# Patient Record
Sex: Male | Born: 1995 | Race: White | Hispanic: No | Marital: Single | Smoking: Former smoker
Health system: Southern US, Community
[De-identification: ages and names within clinical notes are randomized; demographics above are authoritative.]

## PROBLEM LIST (undated history)

## (undated) DIAGNOSIS — R011 Cardiac murmur, unspecified: Secondary | ICD-10-CM

## (undated) HISTORY — DX: Cardiac murmur, unspecified: R01.1

---
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
## 2014-01-05 HISTORY — PX: FINGER SURGERY: SHX640

## 2019-05-17 ENCOUNTER — Encounter: Payer: Self-pay | Admitting: *Deleted

## 2019-05-18 ENCOUNTER — Ambulatory Visit: Payer: Self-pay | Admitting: Cardiology

## 2019-05-18 ENCOUNTER — Encounter: Payer: Self-pay | Admitting: Cardiology

## 2019-05-31 ENCOUNTER — Other Ambulatory Visit: Payer: Self-pay

## 2019-05-31 ENCOUNTER — Emergency Department (HOSPITAL_COMMUNITY): Payer: Self-pay

## 2019-05-31 ENCOUNTER — Emergency Department (HOSPITAL_COMMUNITY)
Admission: EM | Admit: 2019-05-31 | Discharge: 2019-06-01 | Disposition: A | Discharge status: Home / Self Care | Payer: Self-pay | Attending: Emergency Medicine | Admitting: Emergency Medicine

## 2019-05-31 ENCOUNTER — Encounter (HOSPITAL_COMMUNITY): Payer: Self-pay | Admitting: Emergency Medicine

## 2019-05-31 DIAGNOSIS — R0602 Shortness of breath: Secondary | ICD-10-CM | POA: Insufficient documentation

## 2019-05-31 DIAGNOSIS — F17210 Nicotine dependence, cigarettes, uncomplicated: Secondary | ICD-10-CM | POA: Insufficient documentation

## 2019-05-31 DIAGNOSIS — Z20822 Contact with and (suspected) exposure to covid-19: Secondary | ICD-10-CM | POA: Insufficient documentation

## 2019-05-31 DIAGNOSIS — Z79899 Other long term (current) drug therapy: Secondary | ICD-10-CM | POA: Insufficient documentation

## 2019-05-31 DIAGNOSIS — R42 Dizziness and giddiness: Secondary | ICD-10-CM | POA: Insufficient documentation

## 2019-05-31 LAB — POC SARS CORONAVIRUS 2 AG -  ED: SARS Coronavirus 2 Ag: NEGATIVE

## 2019-05-31 NOTE — ED Triage Notes (Signed)
Pt c/o SOB x 3-4 months and constipation x 2 weeks.

## 2019-05-31 NOTE — ED Provider Notes (Signed)
[facility] EMERGENCY DEPARTMENT Provider Note   CSN: [id number] Arrival date & time: 05/31/19  2046     History Chief Complaint  Patient presents with  . Shortness of Breath    Corey Ross is a 24 y.o. male with no pertinent past medical history that presents to the emergency department for multiple complaints.  States that he mainly came to the emergency room because he was having shortness of breath for the past 3 to 4 months, states that it is intermittent and worse at night.  Denies any current chest pain, abdominal pain, nausea, vomiting.  Patient states that he has multiple appointments about 5 coming up for different complaints.  States that he is seeing a cardiologist, urologist, possible neurologist and gastroenterologist in the next couple of weeks.  Patient states that he also has complaints of dizziness and blurry vision for the past month.  States that he also feels more sleepy, and has been getting 8 hours of sleep.  Denies any headache or fevers or chills.  Patient has no past medical history.  Patient has never seen primary care doctor.  Patient states that he smokes daily, used to be full pack a day, however for the past 3 months he has been smoking for cigarettes daily.  States that he also uses occasional opioids that he gets from friends, states that the last time he did that was 2 weeks ago.  Denies any IV drug use or marijuana use.  Denies any alcohol.  Also mentions some constipation,  Last bowel movement yesterday.  HPI     History reviewed. No pertinent past medical history.  There are no problems to display for this patient.   History reviewed. No pertinent surgical history.     Family History  Problem Relation Age of Onset  . Cancer Brother     Social History   Tobacco Use  . Smoking status: Current Every Day Smoker    Types: Cigarettes  . Smokeless tobacco: Never Used  Substance Use Topics  . Alcohol use: Never  . Drug use: Yes    Types:  Marijuana    Comment: percs    Home Medications Prior to Admission medications   Not on File    Allergies    Amoxicillin, Morphine and related, and Penicillins  Review of Systems   Review of Systems  Constitutional: Negative for chills, diaphoresis, fatigue and fever.  HENT: Negative for congestion, sore throat and trouble swallowing.   Eyes: Negative for pain and visual disturbance.  Respiratory: Positive for cough and shortness of breath. Negative for wheezing.   Cardiovascular: Negative for chest pain, palpitations and leg swelling.  Gastrointestinal: Negative for abdominal distention, abdominal pain, diarrhea, nausea and vomiting.  Genitourinary: Negative for difficulty urinating and frequency.  Musculoskeletal: Negative for back pain, neck pain and neck stiffness.  Skin: Negative for pallor.  Neurological: Positive for dizziness. Negative for tremors, seizures, syncope, speech difficulty, weakness, light-headedness, numbness and headaches.  Psychiatric/Behavioral: Positive for sleep disturbance. Negative for confusion and hallucinations. The patient is not nervous/anxious.     Physical Exam Updated Vital Signs BP (!) 154/94 (BP Location: Left Arm)   Pulse (!) 105   Temp 98.1 F (36.7 C) (Oral)   Resp 20   Ht 5' 9.5" (1.765 m)   Wt 90.3 kg   SpO2 98%   BMI 28.97 kg/m   Physical Exam Constitutional:      General: He is not in acute distress.    Appearance: Normal  appearance. He is not ill-appearing, toxic-appearing or diaphoretic.     Comments: Patient keeps falling asleep or speaking to him, is easy to arouse but will fall asleep in the next couple of seconds.  Patient is not ill-appearing  HENT:     Mouth/Throat:     Mouth: Mucous membranes are moist.     Pharynx: Oropharynx is clear.  Eyes:     General: No visual field deficit or scleral icterus.    Extraocular Movements: Extraocular movements intact.     Right eye: Normal extraocular motion.     Left eye:  Normal extraocular motion.     Pupils: Pupils are equal, round, and reactive to light.  Cardiovascular:     Rate and Rhythm: Normal rate and regular rhythm.     Pulses: Normal pulses.     Heart sounds: Normal heart sounds.  Pulmonary:     Effort: Pulmonary effort is normal. No respiratory distress.     Breath sounds: Normal breath sounds. No stridor. No wheezing, rhonchi or rales.  Chest:     Chest wall: No tenderness.  Abdominal:     General: Abdomen is flat. There is no distension.     Palpations: Abdomen is soft.     Tenderness: There is no abdominal tenderness. There is no guarding or rebound.  Musculoskeletal:        General: No swelling or tenderness. Normal range of motion.     Cervical back: Normal range of motion and neck supple. No rigidity.     Right lower leg: No edema.     Left lower leg: No edema.  Skin:    General: Skin is warm and dry.     Capillary Refill: Capillary refill takes less than 2 seconds.     Coloration: Skin is not pale.  Neurological:     General: No focal deficit present.     Mental Status: He is alert and oriented to person, place, and time.     Cranial Nerves: Cranial nerves are intact. No cranial nerve deficit, dysarthria or facial asymmetry.     Sensory: Sensation is intact. No sensory deficit.     Motor: Motor function is intact. No weakness, tremor, atrophy, abnormal muscle tone, seizure activity or pronator drift.     Coordination: Coordination is intact. Finger-Nose-Finger Test normal. Rapid alternating movements normal.     Gait: Gait is intact. Gait normal.     Comments: Patient does keep drifting to sleep while speaking to him, is easy to arouse.   Psychiatric:        Mood and Affect: Mood normal.        Behavior: Behavior normal.     ED Results / Procedures / Treatments   Labs (all labs ordered are listed, but only abnormal results are displayed) Labs Reviewed  COMPREHENSIVE METABOLIC PANEL - Abnormal; Notable for the following  components:      Result Value   Glucose, Bld 101 (*)    Total Protein 6.3 (*)    All other components within normal limits  SARS CORONAVIRUS 2 (TAT 6-24 HRS)  CBC WITH DIFFERENTIAL/PLATELET  URINALYSIS, ROUTINE W REFLEX MICROSCOPIC  RAPID URINE DRUG SCREEN, HOSP PERFORMED  D-DIMER, QUANTITATIVE (NOT AT [facility])  POC SARS CORONAVIRUS 2 AG -  ED    EKG EKG Interpretation  Date/Time:  Wednesday May 31 2019 23:31:20 EDT Ventricular Rate:  74 PR Interval:    QRS Duration: 93 QT Interval:  376 QTC Calculation: 418 R Axis:  59 Text Interpretation: Sinus rhythm Normal ECG Confirmed by Gilda Crease [phone]) on 05/31/2019 11:40:38 PM   Radiology CT Head Wo Contrast  Result Date: 05/31/2019 CLINICAL DATA:  Dizziness EXAM: CT HEAD WITHOUT CONTRAST TECHNIQUE: Contiguous axial images were obtained from the base of the skull through the vertex without intravenous contrast. COMPARISON:  CT report 10/19/2014 FINDINGS: Brain: No evidence of acute infarction, hemorrhage, hydrocephalus, extra-axial collection or mass lesion/mass effect. Vascular: No hyperdense vessel or unexpected calcification. Skull: Normal. Negative for fracture or focal lesion. Sinuses/Orbits: No acute finding. Other: None IMPRESSION: Negative non contrasted CT appearance of the brain Electronically Signed   By: Jasmine Pang M.D.   On: 05/31/2019 23:54   DG Chest Port 1 View  Result Date: 05/31/2019 CLINICAL DATA:  Shortness of breath for 3-4 months and constipation for 2 weeks, negative COVID-19 EXAM: PORTABLE CHEST 1 VIEW COMPARISON:  None FINDINGS: No consolidation, features of edema, pneumothorax, or effusion. Pulmonary vascularity is normally distributed. The cardiomediastinal contours are unremarkable. No acute osseous or soft tissue abnormality. IMPRESSION: No acute cardiopulmonary abnormality. Electronically Signed   By: Kreg Shropshire M.D.   On: 05/31/2019 22:44    Procedures Procedures (including critical care  time)  Medications Ordered in ED Medications - No data to display  ED Course  I have reviewed the triage vital signs and the nursing notes.  Pertinent labs & imaging results that were available during my care of the patient were reviewed by me and considered in my medical decision making (see chart for details).    MDM Rules/Calculators/A&P                     Corey Ross is a 24 y.o. male with no pertinent past medical history that presents to the emergency department for multiple complaints.  Patient's main complaint is shortness of breath, states that it has been occurring for 4 months.  Shortness of breath is pleuritic, denies any chest pain.  Has no risk factors for PE, will get a D-dimer since shortness of breath is pleuritic and patient is tachycardia to 105.  On exam patient is dozing off multiple times throughout physical exam, I will get a urine drug screen and CT head at this time.  Patient does have a normal neuro exam with normal gait. No blurry vision on exam.   .Differential diagnoses considered include .The causes for shortness of breath include but are not limited to Cardiac (AHF, pericardial effusion and tamponade, arrhythmias, ischemia, etc) Respiratory (COPD, asthma, pneumonia, pneumothorax, primary pulmonary hypertension, PE/VQ mismatch) Hematological (anemia) Neuromuscular (ALS, Guillain-Barr, etc)  Initial interventions none, patient's exam is benign.  Patient is not complaining of any pain.  Denies any drug use for the past 2 weeks.  States that he occasionally takes his friend's oxycodone, at least once a month.  ECG interpreted by me demonstrated sinus rhythm. CXR interpreted by me demonstrated no acute abnormalities.  Labs demonstrated normal CBC and chemistries.  Covid swab negative.  D-dimer negative.  Urine drug screen positive for opioids, amphetamines.  After confronting the patient about this, patient states that he forgot that he took some opioids  yesterday.  Discussed that patient symptoms are most likely due to drug usage, discussed that he needs to stop doing drugs.  Given the above findings, my suspicion is that patient needs primary care to handle his multiple complaints.  Nothing acutely going on in the emergency department.  Patient does have follow-up with multiple specialists the  next couple weeks.  Discussed shortness of breath could actually be due to anxiety. Doubt asthma, lung exam benign. Pt does seem anxious about all his symptoms. Normal lung exam.   Doubt need for further emergent work up at this time. I explained the diagnosis and have given explicit precautions to return to the ER including for any other new or worsening symptoms. The patient understands and accepts the medical plan as it's been dictated and I have answered their questions. Discharge instructions concerning home care and prescriptions have been given. The patient is STABLE and is discharged to home in good condition  The plan for this patient was discussed with Dr. Estell Harpin, who voiced agreement and who oversaw evaluation and treatment of this patient. Final Clinical Impression(s) / ED Diagnoses Final diagnoses:  SOB (shortness of breath)    Rx / DC Orders ED Discharge Orders    None       Farrel Gordon, PA-C 06/02/19 1038    Bethann Berkshire, MD 06/02/19 1243

## 2019-06-01 LAB — URINALYSIS, ROUTINE W REFLEX MICROSCOPIC
Bilirubin Urine: NEGATIVE
Glucose, UA: NEGATIVE mg/dL
Hgb urine dipstick: NEGATIVE
Ketones, ur: NEGATIVE mg/dL
Leukocytes,Ua: NEGATIVE
Nitrite: NEGATIVE
Protein, ur: NEGATIVE mg/dL
Specific Gravity, Urine: 1.025 (ref 1.005–1.030)
pH: 6 (ref 5.0–8.0)

## 2019-06-01 LAB — CBC WITH DIFFERENTIAL/PLATELET
Abs Immature Granulocytes: 0.01 10*3/uL (ref 0.00–0.07)
Basophils Absolute: 0.1 10*3/uL (ref 0.0–0.1)
Basophils Relative: 1 %
Eosinophils Absolute: 0.3 10*3/uL (ref 0.0–0.5)
Eosinophils Relative: 5 %
HCT: 40.2 % (ref 39.0–52.0)
Hemoglobin: 13.5 g/dL (ref 13.0–17.0)
Immature Granulocytes: 0 %
Lymphocytes Relative: 40 %
Lymphs Abs: 2.2 10*3/uL (ref 0.7–4.0)
MCH: 28.7 pg (ref 26.0–34.0)
MCHC: 33.6 g/dL (ref 30.0–36.0)
MCV: 85.4 fL (ref 80.0–100.0)
Monocytes Absolute: 0.4 10*3/uL (ref 0.1–1.0)
Monocytes Relative: 8 %
Neutro Abs: 2.6 10*3/uL (ref 1.7–7.7)
Neutrophils Relative %: 46 %
Platelets: 263 10*3/uL (ref 150–400)
RBC: 4.71 MIL/uL (ref 4.22–5.81)
RDW: 12 % (ref 11.5–15.5)
WBC: 5.6 10*3/uL (ref 4.0–10.5)
nRBC: 0 % (ref 0.0–0.2)

## 2019-06-01 LAB — COMPREHENSIVE METABOLIC PANEL
ALT: 35 U/L (ref 0–44)
AST: 24 U/L (ref 15–41)
Albumin: 3.8 g/dL (ref 3.5–5.0)
Alkaline Phosphatase: 65 U/L (ref 38–126)
Anion gap: 7 (ref 5–15)
BUN: 16 mg/dL (ref 6–20)
CO2: 30 mmol/L (ref 22–32)
Calcium: 9 mg/dL (ref 8.9–10.3)
Chloride: 101 mmol/L (ref 98–111)
Creatinine, Ser: 0.88 mg/dL (ref 0.61–1.24)
GFR calc Af Amer: >60 mL/min (ref >60)
GFR calc non Af Amer: >60 mL/min (ref >60)
Glucose, Bld: 101 mg/dL — ABNORMAL HIGH (ref 70–99)
Potassium: 3.8 mmol/L (ref 3.5–5.1)
Sodium: 138 mmol/L (ref 135–145)
Total Bilirubin: 0.4 mg/dL (ref 0.3–1.2)
Total Protein: 6.3 g/dL — ABNORMAL LOW (ref 6.5–8.1)

## 2019-06-01 LAB — RAPID URINE DRUG SCREEN, HOSP PERFORMED
Amphetamines: POSITIVE — AB
Barbiturates: NOT DETECTED
Benzodiazepines: NOT DETECTED
Cocaine: NOT DETECTED
Opiates: POSITIVE — AB
Tetrahydrocannabinol: POSITIVE — AB

## 2019-06-01 LAB — SARS CORONAVIRUS 2 (TAT 6-24 HRS): SARS Coronavirus 2: NEGATIVE

## 2019-06-01 LAB — D-DIMER, QUANTITATIVE: D-Dimer, Quant: <0.27 ug/mL-FEU (ref 0.00–0.50)

## 2019-06-01 NOTE — Discharge Instructions (Addendum)
You were seen today for shortness of breath, all of your labs are reassuring.  I want you to go to all of your appointments as they are scheduled.  Come back to the emergency department if you have any worsening symptoms.  Stop doing drugs.  You can use the resource guide as needed for this.   .Your COVID test is pending; you should expect results in 2-3 days. You can access your results on your MyChart--if you test positive you should receive a phone call.  In the meantime follow CDC guidelines and quarantine, wear a mask, wash hands often.     Please return to ED if you feel have difficulty breathing or have emergent, new or concerning symptoms.  Patients who have symptoms consistent with COVID-19 should self isolated for: At least 3 days (72 hours) have passed since recovery, defined as resolution of fever without the use of fever reducing medications and improvement in respiratory symptoms (e.g., cough, shortness of breath), and At least 7 days have passed since symptoms first appeared.       Person Under Monitoring Name: Corey Ross  Location: [place] [address]   Infection Prevention Recommendations for Individuals Confirmed to have, or Being Evaluated for, 2019 Novel Coronavirus (COVID-19) Infection Who Receive Care at Home  Individuals who are confirmed to have, or are being evaluated for, COVID-19 should follow the prevention steps below until a healthcare provider or local or state health department says they can return to normal activities.  Stay home except to get medical care You should restrict activities outside your home, except for getting medical care. Do not go to work, school, or public areas, and do not use public transportation or taxis.  Call ahead before visiting your doctor Before your medical appointment, call the healthcare provider and tell them that you have, or are being evaluated for, COVID-19 infection. This will help the healthcare  provider's office take steps to keep other people from getting infected. Ask your healthcare provider to call the local or state health department.  Monitor your symptoms Seek prompt medical attention if your illness is worsening (e.g., difficulty breathing). Before going to your medical appointment, call the healthcare provider and tell them that you have, or are being evaluated for, COVID-19 infection. Ask your healthcare provider to call the local or state health department.  Wear a facemask You should wear a facemask that covers your nose and mouth when you are in the same room with other people and when you visit a healthcare provider. People who live with or visit you should also wear a facemask while they are in the same room with you.  Separate yourself from other people in your home As much as possible, you should stay in a different room from other people in your home. Also, you should use a separate bathroom, if available.  Avoid sharing household items You should not share dishes, drinking glasses, cups, eating utensils, towels, bedding, or other items with other people in your home. After using these items, you should wash them thoroughly with soap and water.  Cover your coughs and sneezes Cover your mouth and nose with a tissue when you cough or sneeze, or you can cough or sneeze into your sleeve. Throw used tissues in a lined trash can, and immediately wash your hands with soap and water for at least 20 seconds or use an alcohol-based hand rub.  Wash your [facility] your hands often and thoroughly with soap  and water for at least 20 seconds. You can use an alcohol-based hand sanitizer if soap and water are not available and if your hands are not visibly dirty. Avoid touching your eyes, nose, and mouth with unwashed hands.   Prevention Steps for Caregivers and Household Members of Individuals Confirmed to have, or Being Evaluated for, COVID-19 Infection Being Cared for  in the Home  If you live with, or provide care at home for, a person confirmed to have, or being evaluated for, COVID-19 infection please follow these guidelines to prevent infection:  Follow healthcare provider's instructions Make sure that you understand and can help the patient follow any healthcare provider instructions for all care.  Provide for the patient's basic needs You should help the patient with basic needs in the home and provide support for getting groceries, prescriptions, and other personal needs.  Monitor the patient's symptoms If they are getting sicker, call his or her medical provider and tell them that the patient has, or is being evaluated for, COVID-19 infection. This will help the healthcare provider's office take steps to keep other people from getting infected. Ask the healthcare provider to call the local or state health department.  Limit the number of people who have contact with the patient If possible, have only one caregiver for the patient. Other household members should stay in another home or place of residence. If this is not possible, they should stay in another room, or be separated from the patient as much as possible. Use a separate bathroom, if available. Restrict visitors who do not have an essential need to be in the home.  Keep older adults, very young children, and other sick people away from the patient Keep older adults, very young children, and those who have compromised immune systems or chronic health conditions away from the patient. This includes people with chronic heart, lung, or kidney conditions, diabetes, and cancer.  Ensure good ventilation Make sure that shared spaces in the home have good air flow, such as from an air conditioner or an opened window, weather permitting.  Wash your hands often Wash your hands often and thoroughly with soap and water for at least 20 seconds. You can use an alcohol based hand sanitizer if soap  and water are not available and if your hands are not visibly dirty. Avoid touching your eyes, nose, and mouth with unwashed hands. Use disposable paper towels to dry your hands. If not available, use dedicated cloth towels and replace them when they become wet.  Wear a facemask and gloves Wear a disposable facemask at all times in the room and gloves when you touch or have contact with the patient's blood, body fluids, and/or secretions or excretions, such as sweat, saliva, sputum, nasal mucus, vomit, urine, or feces.  Ensure the mask fits over your nose and mouth tightly, and do not touch it during use. Throw out disposable facemasks and gloves after using them. Do not reuse. Wash your hands immediately after removing your facemask and gloves. If your personal clothing becomes contaminated, carefully remove clothing and launder. Wash your hands after handling contaminated clothing. Place all used disposable facemasks, gloves, and other waste in a lined container before disposing them with other household waste. Remove gloves and wash your hands immediately after handling these items.  Do not share dishes, glasses, or other household items with the patient Avoid sharing household items. You should not share dishes, drinking glasses, cups, eating utensils, towels, bedding, or other items with a  patient who is confirmed to have, or being evaluated for, COVID-19 infection. After the person uses these items, you should wash them thoroughly with soap and water.  Wash laundry thoroughly Immediately remove and wash clothes or bedding that have blood, body fluids, and/or secretions or excretions, such as sweat, saliva, sputum, nasal mucus, vomit, urine, or feces, on them. Wear gloves when handling laundry from the patient. Read and follow directions on labels of laundry or clothing items and detergent. In general, wash and dry with the warmest temperatures recommended on the label.  Clean all areas the  individual has used often Clean all touchable surfaces, such as counters, tabletops, doorknobs, bathroom fixtures, toilets, phones, keyboards, tablets, and bedside tables, every day. Also, clean any surfaces that may have blood, body fluids, and/or secretions or excretions on them. Wear gloves when cleaning surfaces the patient has come in contact with. Use a diluted bleach solution (e.g., dilute bleach with 1 part bleach and 10 parts water) or a household disinfectant with a label that says EPA-registered for coronaviruses. To make a bleach solution at home, add 1 tablespoon of bleach to 1 quart (4 cups) of water. For a larger supply, add  cup of bleach to 1 gallon (16 cups) of water. Read labels of cleaning products and follow recommendations provided on product labels. Labels contain instructions for safe and effective use of the cleaning product including precautions you should take when applying the product, such as wearing gloves or eye protection and making sure you have good ventilation during use of the product. Remove gloves and wash hands immediately after cleaning.  Monitor yourself for signs and symptoms of illness Caregivers and household members are considered close contacts, should monitor their health, and will be asked to limit movement outside of the home to the extent possible. Follow the monitoring steps for close contacts listed on the symptom monitoring form.   ? If you have additional questions, contact your local health department or call the epidemiologist on call at [phone] (available 24/7). ? This guidance is subject to change. For the most up-to-date guidance from [facility], please refer to their website: [email or web address]

## 2019-06-06 ENCOUNTER — Ambulatory Visit: Payer: Self-pay | Admitting: Cardiology

## 2019-06-14 ENCOUNTER — Institutional Professional Consult (permissible substitution): Payer: Self-pay | Admitting: Pulmonary Disease

## 2019-06-20 ENCOUNTER — Ambulatory Visit (INDEPENDENT_AMBULATORY_CARE_PROVIDER_SITE_OTHER): Payer: Self-pay | Admitting: Urology

## 2019-06-20 ENCOUNTER — Encounter: Payer: Self-pay | Admitting: Urology

## 2019-06-20 ENCOUNTER — Other Ambulatory Visit: Payer: Self-pay

## 2019-06-20 VITALS — BP 130/84 | HR 86 | Temp 96.8°F | Ht 69.0 in | Wt 190.0 lb

## 2019-06-20 DIAGNOSIS — N529 Male erectile dysfunction, unspecified: Secondary | ICD-10-CM

## 2019-06-20 LAB — POCT URINALYSIS DIPSTICK
Bilirubin, UA: NEGATIVE
Blood, UA: NEGATIVE
Glucose, UA: NEGATIVE
Ketones, UA: NEGATIVE
Leukocytes, UA: NEGATIVE
Nitrite, UA: NEGATIVE
Protein, UA: POSITIVE — AB
Spec Grav, UA: >=1.03 — AB (ref 1.010–1.025)
Urobilinogen, UA: 0.2 E.U./dL
pH, UA: 5 (ref 5.0–8.0)

## 2019-06-20 MED ORDER — SILDENAFIL CITRATE 100 MG PO TABS: 100.0000 mg | ORAL_TABLET | Freq: Every day | ORAL | 2 refills | Status: AC | PRN

## 2019-06-20 NOTE — Progress Notes (Signed)
H&P  Chief Complaint: Erectile Dysfunction  History of Present Illness:   6.15.2021: Corey Ross is a 24 yo male presenting today with complaints of worsening ED that began around 4 years prior. At present, he reports that he is entirely unable to achieve erection. He has not tried oral medications for this though he has tried OTC "gas station pills" for sexual performance. He does take hydrocodone intermittently for chronic back pain -- states he is given these by older family member -- and does have a hx of drug abuse with a recent relapse. He is concerned that his ED could be the result of nerve damage from work place injury -- he has intermittent finger/toe numbness. He denies hx of penile injury. He denies any hx of urinary issues though does occasional have to strain to initiate urinary stream. He is able to achieve adequate erection when not with a partner.  Past Medical History:  Diagnosis Date  . Heart murmur     History reviewed. No pertinent surgical history.  Home Medications:  Allergies as of 06/20/2019      Reactions   Acetaminophen Diarrhea   Amoxicillin    Doxycycline Swelling   Ibuprofen Other (See Comments)   Headache, eyes burn   Morphine And Related    Penicillin G Swelling   Penicillins       Medication List    as of June 20, 2019  3:28 PM   You have not been prescribed any medications.     Allergies:  Allergies  Allergen Reactions  . Acetaminophen Diarrhea  . Amoxicillin   . Doxycycline Swelling  . Ibuprofen Other (See Comments)    Headache, eyes burn  . Morphine And Related   . Penicillin G Swelling  . Penicillins     Family History  Problem Relation Age of Onset  . Cancer Brother     Social History:  reports that he quit smoking about a year ago. His smoking use included cigarettes. He quit after 7.00 years of use. He has never used smokeless tobacco. He reports current drug use. Drug: Marijuana. He reports that he does not drink  alcohol.  ROS: Urological Symptom Review  Patient is experiencing the following symptoms: Trouble starting stream (sometimes) Weak stream (sometimes) Penile Pain (sometimes)  Constant pain in legs and lower back pain Numbness in finger and toe  Review of Systems Gastrointestinal (upper)  : Indigestion/heartburn Gastrointestinal (lower) : Constipation Constitutional : Night Sweats Skin: Skin rash/lesion Eyes: Blurred vision Ear/Nose/Throat : Sinus problems Hematologic/Lymphatic: Negative for Hematologic/Lymphatic symptoms Cardiovascular : Negative for cardiovascular symptoms Respiratory : Shortness of breath Endocrine: Negative for endocrine symptoms Musculoskeletal: Back pain Neurological: Dizziness Psychologic: Depression  Physical Exam:  Vital signs in last 24 hours: BP 130/84   Pulse 86   Temp (!) 96.8 F (36 C)   Ht 5\' 9"  (1.753 m)   Wt 190 lb (86.2 kg)   BMI 28.06 kg/m  Constitutional:  Alert and oriented, No acute distress Cardiovascular: Regular rate, pulses adequately strong in lower ext's. Respiratory: Normal respiratory effort GI: Abdomen is soft, nontender, nondistended, no abdominal masses. No CVAT. No hernias. Genitourinary: Normal male phallus, testes are descended bilaterally and non-tender and without masses, scrotum is normal in appearance without lesions or masses, perineum is normal on inspection. Lymphatic: No lymphadenopathy Neurologic: Grossly intact, no focal deficits Psychiatric: Normal mood and affect   Results for orders placed or performed in visit on 06/20/19 (from the past 24 hour(s))  POCT urinalysis dipstick  Status: Abnormal   Collection Time: 06/20/19  3:24 PM  Result Value Ref Range   Color, UA amber    Clarity, UA     Glucose, UA Negative Negative   Bilirubin, UA neg    Ketones, UA neg    Spec Grav, UA >=1.030 (A) 1.010 - 1.025   Blood, UA neg    pH, UA 5.0 5.0 - 8.0   Protein, UA Positive (A) Negative    Urobilinogen, UA 0.2 0.2 or 1.0 E.U./dL   Nitrite, UA neg    Leukocytes, UA Negative Negative   Appearance clear    Odor     No results found for this or any previous visit (from the past 240 hour(s)).  I have reviewed prior pt notes  I have reviewed notes from referring/previous physicians (ER)  I have reviewed urinalysis results    Impression/Assessment:  GU/Physical exam today was entirely normal. 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 He would certainly benefit from some increased cardiovascular fitness.   Plan:  1. 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 No need for specific evaluation.   2. Advised to work to improve cardiovascular fitness.  3. I am fine starting him on short trial of sildenafil.   4. Return for OV in 6 mo

## 2019-06-20 NOTE — Progress Notes (Signed)
See progress note.

## 2019-06-28 ENCOUNTER — Other Ambulatory Visit: Payer: Self-pay

## 2019-06-28 ENCOUNTER — Encounter: Payer: Self-pay | Admitting: *Deleted

## 2019-06-28 ENCOUNTER — Encounter: Payer: Self-pay | Admitting: Cardiology

## 2019-06-28 ENCOUNTER — Ambulatory Visit (INDEPENDENT_AMBULATORY_CARE_PROVIDER_SITE_OTHER): Payer: Self-pay | Admitting: Cardiology

## 2019-06-28 VITALS — BP 142/90 | HR 89 | Ht 69.5 in | Wt 200.4 lb

## 2019-06-28 DIAGNOSIS — R079 Chest pain, unspecified: Secondary | ICD-10-CM

## 2019-06-28 DIAGNOSIS — R0789 Other chest pain: Secondary | ICD-10-CM

## 2019-06-28 DIAGNOSIS — R0602 Shortness of breath: Secondary | ICD-10-CM

## 2019-06-28 NOTE — Progress Notes (Signed)
Cardiology Office Note  Date: 06/28/2019   ID: CRU KRITIKOS, DOB 05-Feb-1995, MRN [id number]  PCP:  Monico Blitz, MD  Cardiologist:  Rozann Lesches, MD Electrophysiologist:  None   Chief Complaint  Patient presents with   History of chest pain    History of Present Illness: SHAYNE DIGUGLIELMO is a 24 y.o. male referred for cardiology consultation by Dr. Manuella Ghazi for evaluation of shortness of breath.  I reviewed the available records which are limited.  He is here today with significant other.  He describes a few different symptoms.  He states that he has had about 20 episodes of sharp chest discomfort lasting anywhere from a few minutes to an hour over the last year.  These are sporadic, not necessarily precipitated by activity, no alleviating factors.  Unrelated to these events, he also has episodes where he has awakened in the night feeling very short of breath and anxious.  Sometimes when he gets up in the morning he feels like he cannot take in or breathe out a good breath but the symptoms generally go away without intervention.  He sometimes feels a tingling in his fingers as well.  He was seen in the [facility], ER back in May with shortness of breath.  D-dimer was negative.  SARS coronavirus 2 test was negative.  Chest x-ray showed no acute findings.  ECG was normal.  UDS was however positive for THC, amphetamines, and opiates.  He reports no childhood history of asthma or other lung conditions.  He does have erectile dysfunction.  Reports childhood murmur but no history of valvular heart disease or congenital heart disease.  He saw Dr. Manuella Ghazi at [facility] internal medicine once, does not plan to return to that practice however.  I did talk with him about making sure that he gets in with a regular PCP for ongoing health care.  He mentioned [facility] as a possibility.   Past Medical History:  Diagnosis Date   Heart murmur     Past Surgical History:  Procedure Laterality Date    FINGER SURGERY  2016   removed tip of finger to due infection    Current Outpatient Medications  Medication Sig Dispense Refill   cephALEXin (KEFLEX) 500 MG capsule Take 1 capsule by mouth 4 (four) times daily.     Omega-3 Fatty Acids (OMEGA-3 PO) Take 1 capsule by mouth. Occasionally     sildenafil (VIAGRA) 100 MG tablet Take 1 tablet (100 mg total) by mouth daily as needed for erectile dysfunction. 15 tablet 2   No current facility-administered medications for this visit.   Allergies:  Acetaminophen, Amoxicillin, Doxycycline, Ibuprofen, Morphine and related, Penicillin g, and Penicillins   Social History: The patient  reports that he quit smoking about a year ago. His smoking use included cigarettes. He quit after 7.00 years of use. He has never used smokeless tobacco. He reports current drug use. Drug: Marijuana. He reports that he does not drink alcohol.   Family History: The patient's family history includes Cancer in his brother.   ROS:  No palpitations or syncope.  Physical Exam: VS:  BP (!) 142/90    Pulse 89    Ht 5' 9.5" (1.765 m)    Wt 200 lb 6.4 oz (90.9 kg)    SpO2 99%    BMI 29.17 kg/m , BMI Body mass index is 29.17 kg/m.  Wt Readings from Last 3 Encounters:  06/28/19 200 lb 6.4 oz (90.9 kg)  06/20/19 190 lb (  86.2 kg)  05/31/19 199 lb (90.3 kg)    General: Patient appears comfortable at rest. HEENT: Conjunctiva and lids normal, wearing a mask. Neck: Supple, no elevated JVP or carotid bruits, no thyromegaly. Lungs: Clear to auscultation, nonlabored breathing at rest. Cardiac: Regular rate and rhythm, no S3 or significant systolic murmur, no pericardial rub. Abdomen: Soft, bowel sounds present, no guarding or rebound. Extremities: No pitting edema, distal pulses 2+. Skin: Warm and dry. Musculoskeletal: No kyphosis. Neuropsychiatric: Alert and oriented x3, affect grossly appropriate.  ECG:  An ECG dated 05/31/2019 was personally reviewed today and demonstrated:   Normal sinus rhythm.  Recent Labwork: 05/31/2019: ALT 35; AST 24; BUN 16; Creatinine, Ser 0.88; Hemoglobin 13.5; Platelets 263; Potassium 3.8; Sodium 138   Other Studies Reviewed Today:  Chest x-ray 05/31/2019: FINDINGS: No consolidation, features of edema, pneumothorax, or effusion. Pulmonary vascularity is normally distributed. The cardiomediastinal contours are unremarkable. No acute osseous or soft tissue abnormality.  IMPRESSION: No acute cardiopulmonary abnormality.  Assessment and Plan:  Recurring and largely atypical chest pain as discussed above, also episodic feeling of shortness of breath without definitive precipitant.  Resting ECG is normal.  Chest x-ray from May showed no acute cardiopulmonary abnormalities and D-dimer was normal at that time.  Blood pressure elevated today, but he has no standing history of hypertension.  Does have a previous history of tobacco use.  Reports childhood cardiac murmur but no significant murmur present today.  We will obtain a GXT and also an echocardiogram for further cardiac assessment.  He will meanwhile plan to establish with [facility] for primary care and to direct his further work-up.  Medication Adjustments/Labs and Tests Ordered: Current medicines are reviewed at length with the patient today.  Concerns regarding medicines are outlined above.   Tests Ordered: Orders Placed This Encounter  Procedures   EXERCISE TOLERANCE TEST (ETT)   ECHOCARDIOGRAM COMPLETE    Medication Changes: No orders of the defined types were placed in this encounter.   Disposition:  Follow up test results.  Signed, Jonelle Sidle, MD, [facility] 06/28/2019 4:46 PM    [facility] [address] [place], [place], [place] [address] Phone: [phone]; Fax: [phone]

## 2019-06-28 NOTE — Patient Instructions (Addendum)
Medication Instructions:    Your physician recommends that you continue on your current medications as directed. Please refer to the Current Medication list given to you today.  Labwork:   NONE  Testing/Procedures: Your physician has requested that you have an exercise tolerance test. For further information please visit [email or web address] Please also follow instruction sheet, as given. Your physician has requested that you have an echocardiogram. Echocardiography is a painless test that uses sound waves to create images of your heart. It provides your doctor with information about the size and shape of your heart and how well your heart's chambers and valves are working. This procedure takes approximately one hour. There are no restrictions for this procedure.  Follow-Up:  Your physician recommends that you schedule a follow-up appointment in: pending.  Any Other Special Instructions Will Be Listed Below (If Applicable).  You have been referred to [facility].  If you need a refill on your cardiac medications before your next appointment, please call your pharmacy.

## 2019-07-18 ENCOUNTER — Ambulatory Visit (HOSPITAL_COMMUNITY): Admission: RE | Admit: 2019-07-18 | Payer: Self-pay | Source: Ambulatory Visit

## 2019-07-18 ENCOUNTER — Ambulatory Visit (HOSPITAL_COMMUNITY): Payer: Self-pay | Attending: Cardiology

## 2019-09-21 ENCOUNTER — Institutional Professional Consult (permissible substitution): Payer: Self-pay | Admitting: Pulmonary Disease

## 2019-12-19 ENCOUNTER — Ambulatory Visit: Payer: Self-pay | Admitting: Urology

## 2019-12-19 NOTE — Progress Notes (Incomplete)
H&P  Chief Complaint: ***  History of Present Illness:   12.14.2021:   (below copied from [facility] records):  6.15.2021: Corey Ross is a 24 yo male presenting today with complaints of worsening ED that began around 4 years prior. At present, he reports that he is entirely unable to achieve erection. He has not tried oral medications for this though he has tried OTC "gas station pills" for sexual performance. He does take hydrocodone intermittently for chronic back pain -- states he is given these by older family member -- and does have a hx of drug abuse with a recent relapse. He is concerned that his ED could be the result of nerve damage from work place injury -- he has intermittent finger/toe numbness. He denies hx of penile injury. He denies any hx of urinary issues though does occasional have to strain to initiate urinary stream. He is able to achieve adequate erection when not with a partner.  Past Medical History:  Diagnosis Date  . Heart murmur     Past Surgical History:  Procedure Laterality Date  . FINGER SURGERY  2016   removed tip of finger to due infection    Home Medications:  Allergies as of 12/19/2019      Reactions   Acetaminophen Diarrhea   Amoxicillin    Doxycycline Swelling   Ibuprofen Other (See Comments)   Headache, eyes burn   Morphine And Related    Penicillin G Swelling   Penicillins       Medication List       Accurate as of December 19, 2019  1:14 PM. If you have any questions, ask your nurse or doctor.        OMEGA-3 PO Take 1 capsule by mouth. Occasionally   sildenafil 100 MG tablet Commonly known as: VIAGRA Take 1 tablet (100 mg total) by mouth daily as needed for erectile dysfunction.       Allergies:  Allergies  Allergen Reactions  . Acetaminophen Diarrhea  . Amoxicillin   . Doxycycline Swelling  . Ibuprofen Other (See Comments)    Headache, eyes burn  . Morphine And Related   . Penicillin G Swelling  . Penicillins      Family History  Problem Relation Age of Onset  . Cancer Brother     Social History:  reports that he quit smoking about 17 months ago. His smoking use included cigarettes. He quit after 7.00 years of use. He has never used smokeless tobacco. He reports current drug use. Drug: Marijuana. He reports that he does not drink alcohol.  ROS: A complete review of systems was performed.  All systems are negative except for pertinent findings as noted.  Physical Exam:  Vital signs in last 24 hours: There were no vitals taken for this visit. Constitutional:  Alert and oriented, No acute distress Cardiovascular: Regular rate  Respiratory: Normal respiratory effort GI: Abdomen is soft, nontender, nondistended, no abdominal masses. No CVAT.  Genitourinary: Normal male phallus, testes are descended bilaterally and non-tender and without masses, scrotum is normal in appearance without lesions or masses, perineum is normal on inspection. Lymphatic: No lymphadenopathy Neurologic: Grossly intact, no focal deficits Psychiatric: Normal mood and affect  Laboratory Data:  No results for input(s): WBC, HGB, HCT, PLT in the last 72 hours.  No results for input(s): NA, K, CL, GLUCOSE, BUN, CALCIUM, CREATININE in the last 72 hours.  Invalid input(s): CO3   No results found for this or any previous visit (from the past  24 hour(s)). No results found for this or any previous visit (from the past 240 hour(s)).  Renal Function: No results for input(s): CREATININE in the last 168 hours. CrCl cannot be calculated (Patient's most recent lab result is older than the maximum 21 days allowed.).  Radiologic Imaging: No results found.  Impression/Assessment:  ***  Plan:  ***

## 2020-10-19 IMAGING — CT CT HEAD W/O CM
3 series · 15 of 47 positions shown, 18 images · non-contrast
Comparison: CT report 10/19/2014

CLINICAL DATA: Dizziness

EXAM:
CT HEAD WITHOUT CONTRAST
TECHNIQUE: Contiguous axial images were obtained from the base of the skull
through the vertex without intravenous contrast.

[Series 2: head w o · axial · 0.47mm/px · z∈[+1675,+1800]mm · 9 of 31 slices shown, 12 images]
[im 3/31  brain]
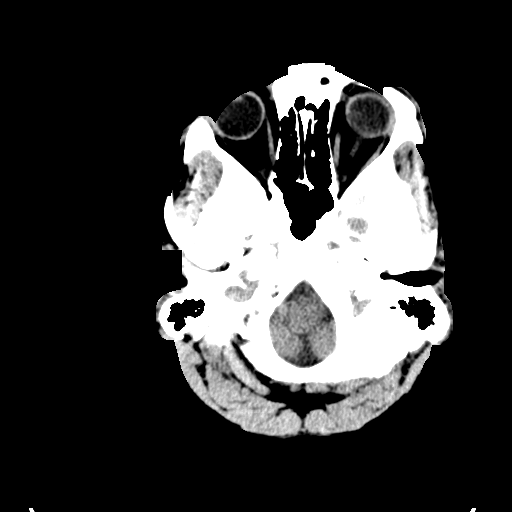
[im 3/31  bone]
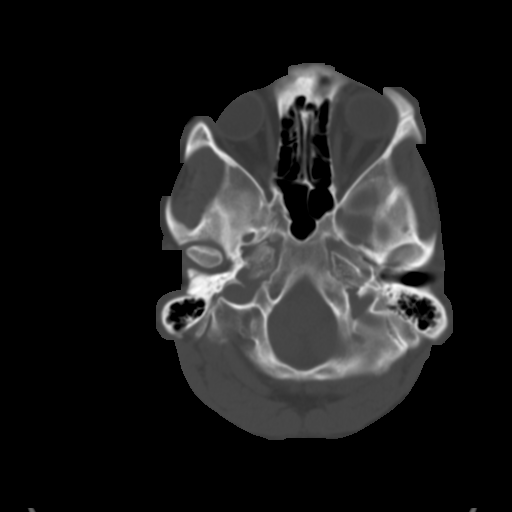
[im 6/31  brain]
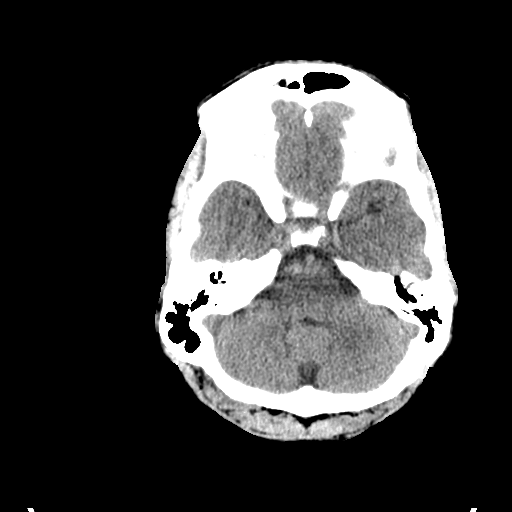
[im 9/31  brain]
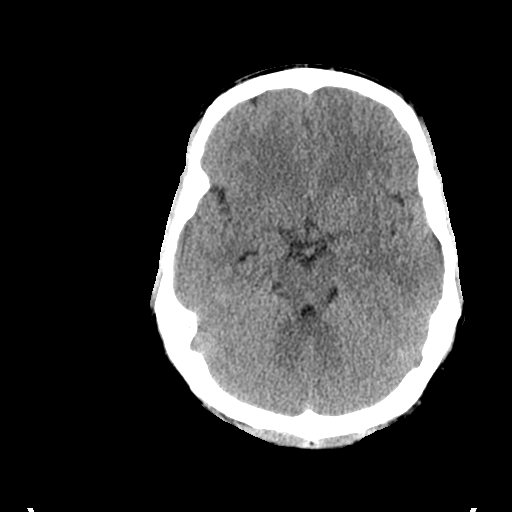
[im 12/31  brain]
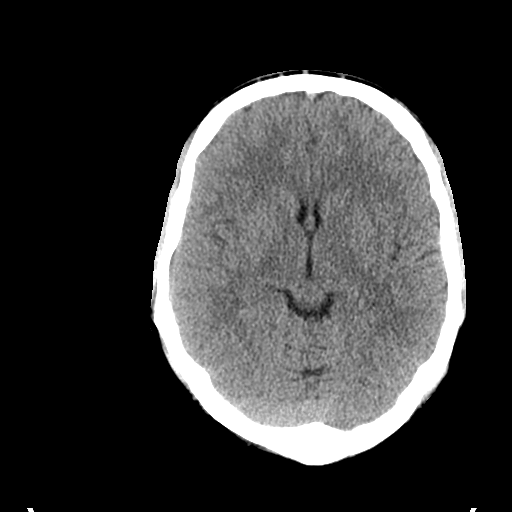
[im 16/31  brain]
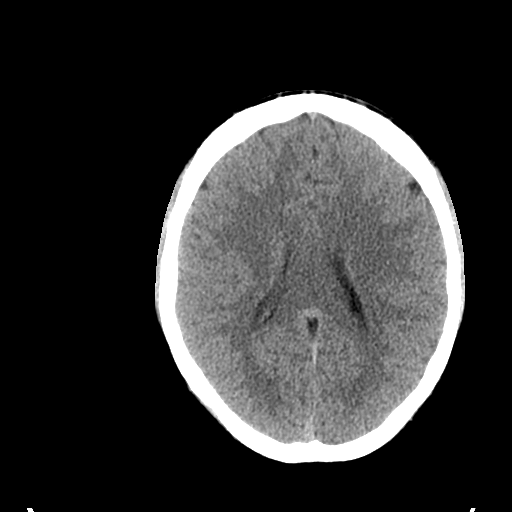
[im 16/31  bone]
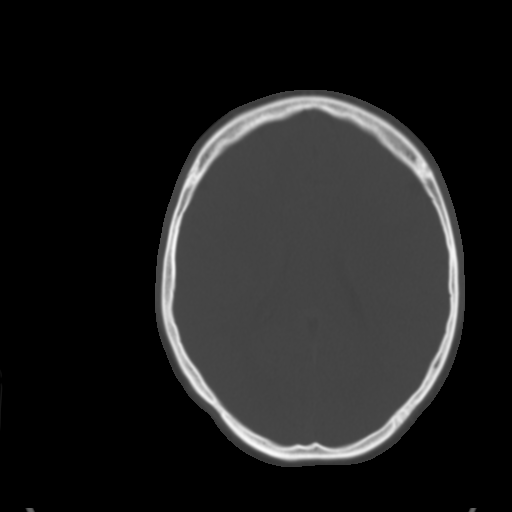
[im 19/31  brain]
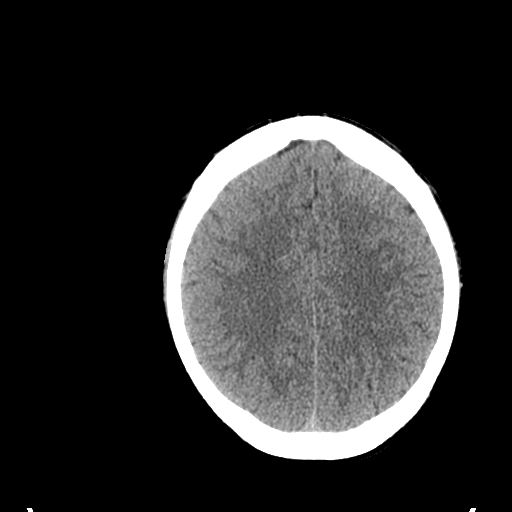
[im 22/31  brain]
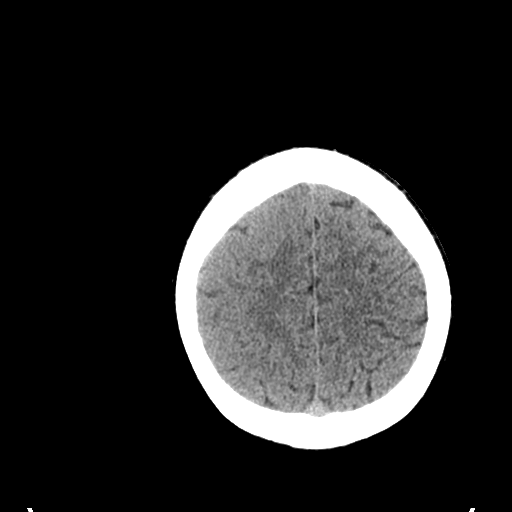
[im 25/31  brain]
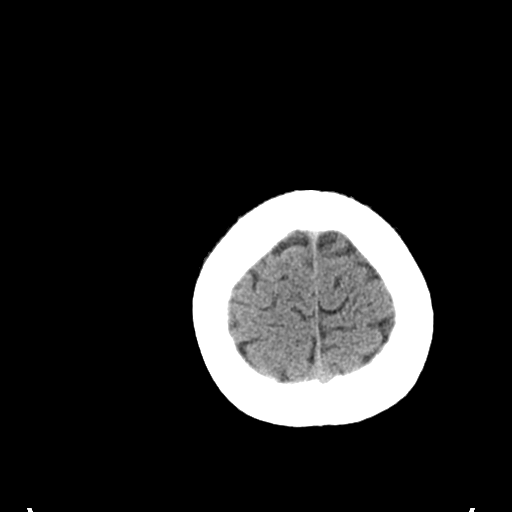
[im 28/31  brain]
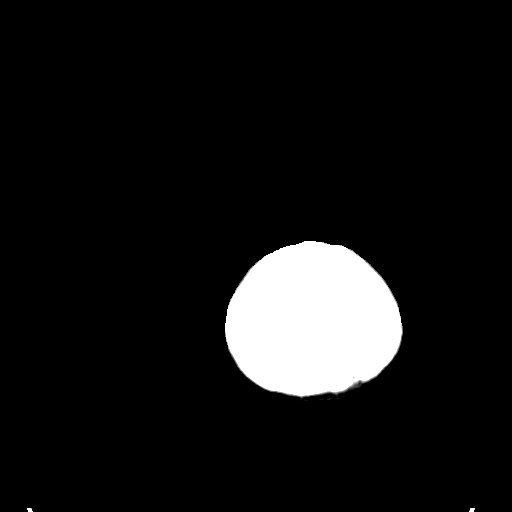
[im 28/31  bone]
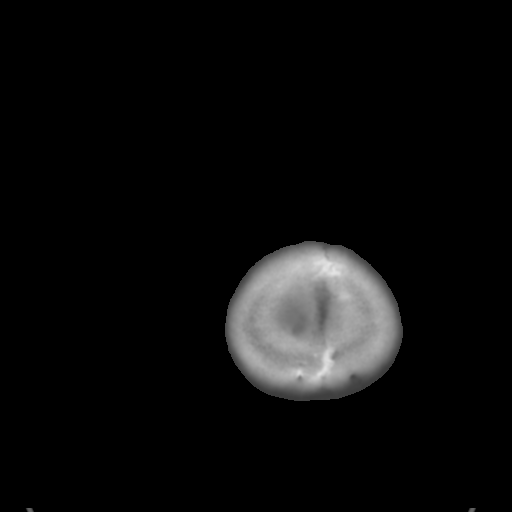

[Series 4: coronal soft · coronal · 0.30mm/px · 3 of 69 slices shown]
[im 23/69  brain]
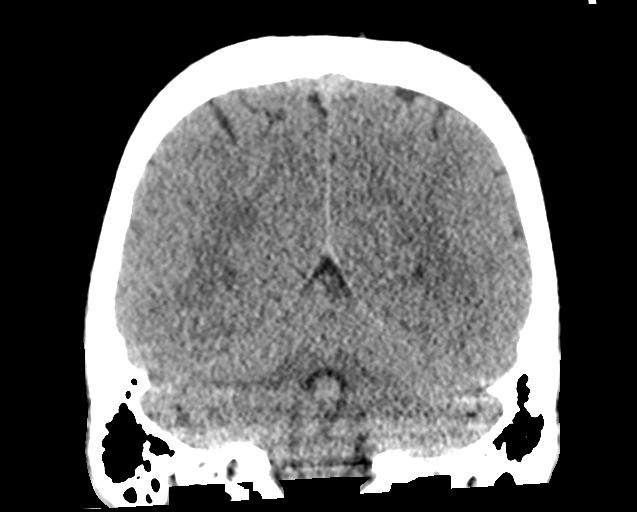
[im 31/69  brain]
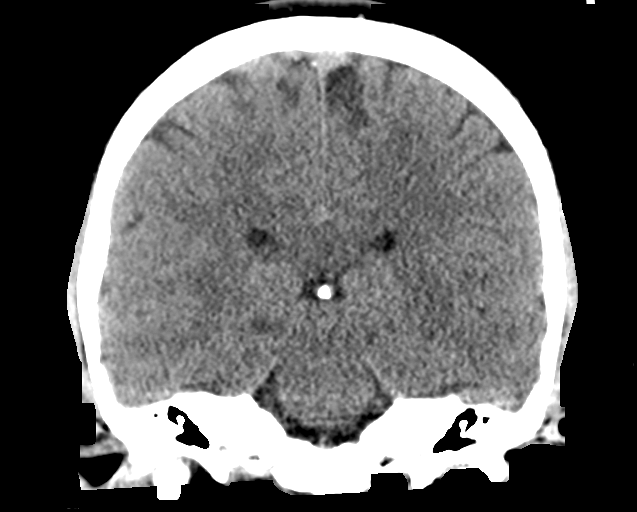
[im 38/69  brain]
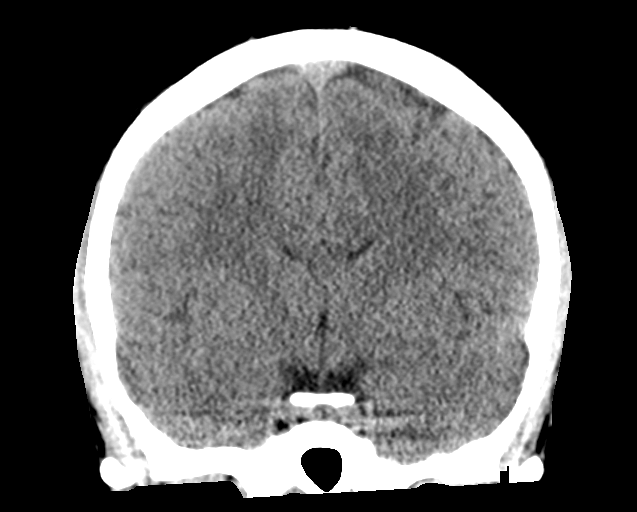

[Series 5: sagittal soft · sagittal · 0.32mm/px · 3 of 55 slices shown]
[im 19/55  brain]
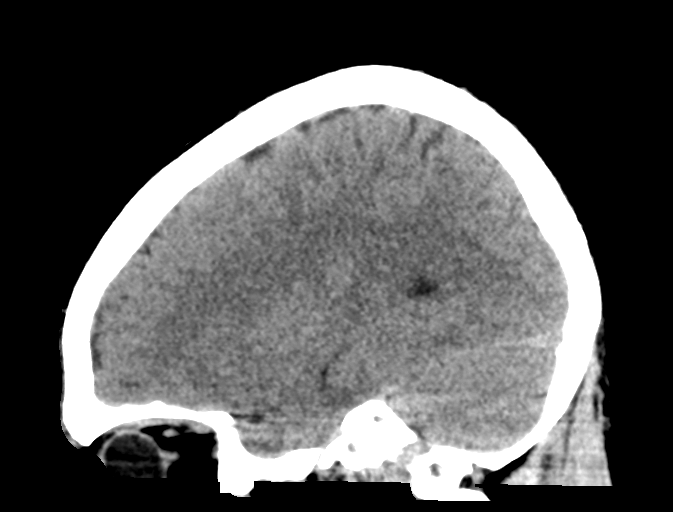
[im 28/55  brain]
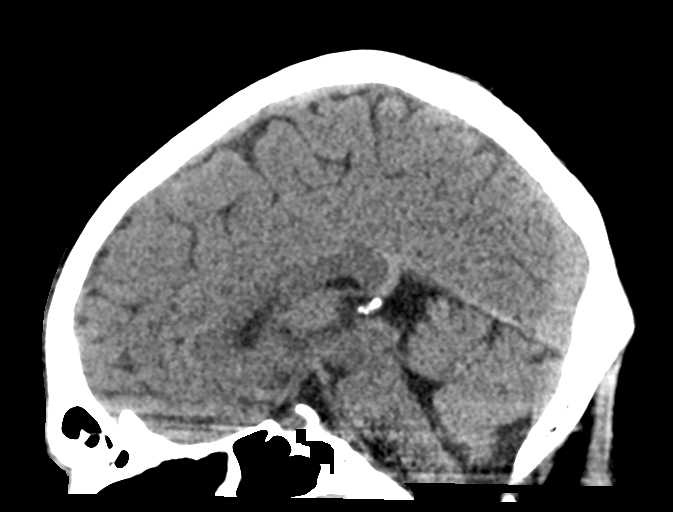
[im 37/55  brain]
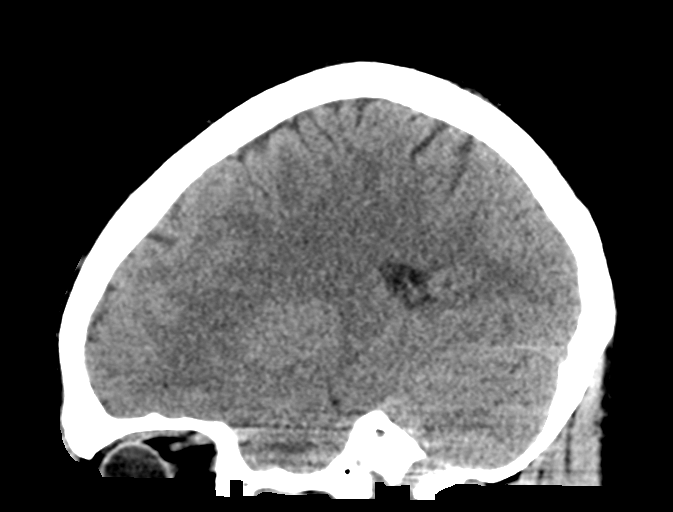

[15 of 47 positions shown; findings below may reference images not displayed]

FINDINGS: Brain: No evidence of acute infarction, hemorrhage, hydrocephalus,
extra-axial collection or mass lesion/mass effect.

Vascular: No hyperdense vessel or unexpected calcification.

Skull: Normal. Negative for fracture or focal lesion.

Sinuses/Orbits: No acute finding.

Other: None
IMPRESSION: Negative non contrasted CT appearance of the brain

## 2020-10-19 IMAGING — DX DG CHEST 1V PORT
1 series · 1 of 1 positions shown · non-contrast
Comparison: None

CLINICAL DATA: Shortness of breath for 3-4 months and constipation
for 2 weeks, negative [id number]

EXAM:
PORTABLE CHEST 1 VIEW

[chest ap]
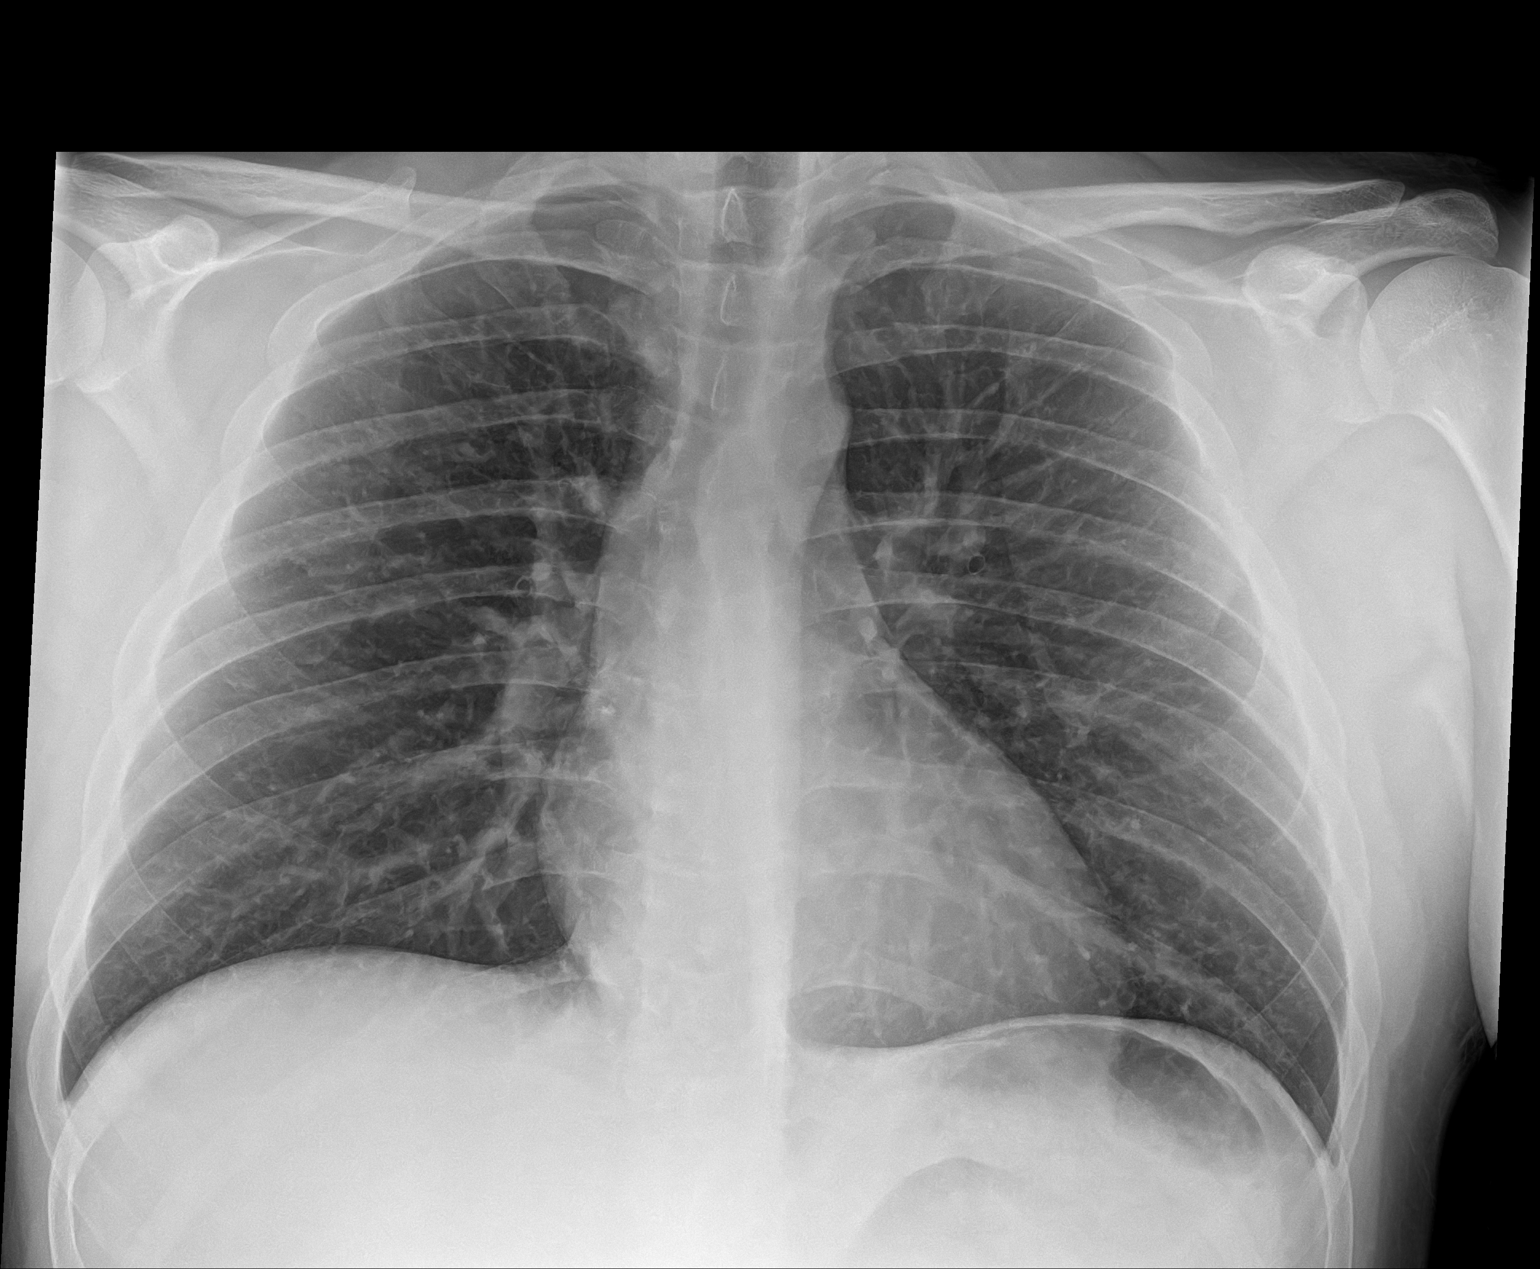

[1 of 1 positions shown; findings below may reference images not displayed]

FINDINGS: No consolidation, features of edema, pneumothorax, or effusion.
Pulmonary vascularity is normally distributed. The cardiomediastinal
contours are unremarkable. No acute osseous or soft tissue
abnormality.
IMPRESSION: No acute cardiopulmonary abnormality.
